# Patient Record
Sex: Male | Born: 1965 | Race: Black or African American | Hispanic: No | Marital: Single | Smoking: Never smoker
Health system: Southern US, Community
[De-identification: ages and names within clinical notes are randomized; demographics above are authoritative.]

## PROBLEM LIST (undated history)

## (undated) DIAGNOSIS — D869 Sarcoidosis, unspecified: Secondary | ICD-10-CM

## (undated) DIAGNOSIS — E119 Type 2 diabetes mellitus without complications: Secondary | ICD-10-CM

## (undated) HISTORY — PX: OTHER SURGICAL HISTORY: SHX169

---

## 2004-09-20 ENCOUNTER — Emergency Department: Payer: Self-pay | Admitting: Emergency Medicine

## 2007-03-29 ENCOUNTER — Emergency Department (HOSPITAL_COMMUNITY): Admission: EM | Admit: 2007-03-29 | Discharge: 2007-03-29 | Payer: Self-pay | Admitting: Emergency Medicine

## 2008-03-23 ENCOUNTER — Emergency Department (HOSPITAL_COMMUNITY): Admission: EM | Admit: 2008-03-23 | Discharge: 2008-03-23 | Payer: Self-pay | Admitting: Emergency Medicine

## 2008-12-21 ENCOUNTER — Ambulatory Visit: Payer: Self-pay | Admitting: Critical Care Medicine

## 2008-12-21 ENCOUNTER — Inpatient Hospital Stay (HOSPITAL_COMMUNITY): Admission: EM | Admit: 2008-12-21 | Discharge: 2008-12-24 | Payer: Self-pay | Admitting: Emergency Medicine

## 2009-01-10 DIAGNOSIS — J930 Spontaneous tension pneumothorax: Secondary | ICD-10-CM

## 2009-01-10 DIAGNOSIS — D869 Sarcoidosis, unspecified: Secondary | ICD-10-CM | POA: Insufficient documentation

## 2009-01-10 DIAGNOSIS — F1010 Alcohol abuse, uncomplicated: Secondary | ICD-10-CM | POA: Insufficient documentation

## 2009-01-10 DIAGNOSIS — J939 Pneumothorax, unspecified: Secondary | ICD-10-CM | POA: Insufficient documentation

## 2009-01-10 DIAGNOSIS — H5440 Blindness, one eye, unspecified eye: Secondary | ICD-10-CM | POA: Insufficient documentation

## 2009-01-10 DIAGNOSIS — S2249XA Multiple fractures of ribs, unspecified side, initial encounter for closed fracture: Secondary | ICD-10-CM | POA: Insufficient documentation

## 2009-01-14 ENCOUNTER — Ambulatory Visit: Payer: Self-pay | Admitting: Emergency Medicine

## 2009-01-14 LAB — CONVERTED CEMR LAB
BUN: 20 mg/dL (ref 6–23)
Potassium: 4.7 meq/L (ref 3.5–5.3)

## 2009-04-26 ENCOUNTER — Encounter: Payer: Self-pay | Admitting: Critical Care Medicine

## 2010-01-31 NOTE — Letter (Signed)
Summary: CMN for [facility]  CMN for [facility]   Imported By: Sherian Rein 05/04/2009 12:07:48  _____________________________________________________________________  External Attachment:    Type:   Image     Comment:   External Document
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
## 2010-03-14 ENCOUNTER — Emergency Department (HOSPITAL_BASED_OUTPATIENT_CLINIC_OR_DEPARTMENT_OTHER)
Admission: EM | Admit: 2010-03-14 | Discharge: 2010-03-15 | Disposition: A | Discharge status: Nursing Home (Includes ICF) | Payer: Medicaid Other | Attending: Emergency Medicine | Admitting: Emergency Medicine

## 2010-03-14 DIAGNOSIS — Z794 Long term (current) use of insulin: Secondary | ICD-10-CM | POA: Insufficient documentation

## 2010-03-14 DIAGNOSIS — E119 Type 2 diabetes mellitus without complications: Secondary | ICD-10-CM | POA: Insufficient documentation

## 2010-03-14 LAB — COMPREHENSIVE METABOLIC PANEL
Albumin: 4 g/dL (ref 3.5–5.2)
Alkaline Phosphatase: 99 U/L (ref 39–117)
BUN: 13 mg/dL (ref 6–23)
Calcium: 9 mg/dL (ref 8.4–10.5)
Creatinine, Ser: 0.9 mg/dL (ref 0.4–1.5)
Glucose, Bld: 259 mg/dL — ABNORMAL HIGH (ref 70–99)
Potassium: 4.2 mEq/L (ref 3.5–5.1)
Total Protein: 8 g/dL (ref 6.0–8.3)

## 2010-03-14 LAB — CBC
HCT: 43.8 % (ref 39.0–52.0)
Hemoglobin: 15.4 g/dL (ref 13.0–17.0)
MCH: 28.5 pg (ref 26.0–34.0)
MCHC: 35.2 g/dL (ref 30.0–36.0)
MCV: 81 fL (ref 78.0–100.0)
RDW: 12.5 % (ref 11.5–15.5)

## 2010-03-14 LAB — DIFFERENTIAL
Eosinophils Relative: 4 % (ref 0–5)
Lymphocytes Relative: 36 % (ref 12–46)
Lymphs Abs: 2.4 10*3/uL (ref 0.7–4.0)
Monocytes Absolute: 0.6 10*3/uL (ref 0.1–1.0)
Monocytes Relative: 10 % (ref 3–12)
Neutro Abs: 3.3 10*3/uL (ref 1.7–7.7)

## 2010-03-14 LAB — POCT TOXICOLOGY PANEL

## 2010-03-14 LAB — ETHANOL

## 2010-03-14 LAB — URINALYSIS, ROUTINE W REFLEX MICROSCOPIC
Glucose, UA: >1000 mg/dL — AB
Hgb urine dipstick: NEGATIVE
Leukocytes, UA: NEGATIVE
Protein, ur: NEGATIVE mg/dL
pH: 6 (ref 5.0–8.0)

## 2010-03-14 LAB — GLUCOSE, CAPILLARY: Glucose-Capillary: 197 mg/dL — ABNORMAL HIGH (ref 70–99)

## 2010-03-14 LAB — URINE MICROSCOPIC-ADD ON

## 2010-03-15 ENCOUNTER — Emergency Department (HOSPITAL_COMMUNITY)
Admission: EM | Admit: 2010-03-15 | Discharge: 2010-03-16 | Disposition: A | Discharge status: Other Acute Inpatient Hospital | Payer: Medicaid Other | Attending: Emergency Medicine | Admitting: Emergency Medicine

## 2010-03-15 DIAGNOSIS — E119 Type 2 diabetes mellitus without complications: Secondary | ICD-10-CM | POA: Insufficient documentation

## 2010-03-15 DIAGNOSIS — F29 Unspecified psychosis not due to a substance or known physiological condition: Secondary | ICD-10-CM | POA: Insufficient documentation

## 2010-03-15 DIAGNOSIS — D869 Sarcoidosis, unspecified: Secondary | ICD-10-CM | POA: Insufficient documentation

## 2010-03-15 LAB — GLUCOSE, CAPILLARY
Glucose-Capillary: 192 mg/dL — ABNORMAL HIGH (ref 70–99)
Glucose-Capillary: 162 mg/dL — ABNORMAL HIGH (ref 70–99)

## 2010-03-16 LAB — HEMOGLOBIN A1C
Hgb A1c MFr Bld: 10.2 % — ABNORMAL HIGH (ref <5.7)
Mean Plasma Glucose: 246 mg/dL — ABNORMAL HIGH (ref <117)

## 2010-03-16 LAB — GLUCOSE, CAPILLARY
Glucose-Capillary: 188 mg/dL — ABNORMAL HIGH (ref 70–99)
Glucose-Capillary: 175 mg/dL — ABNORMAL HIGH (ref 70–99)
Glucose-Capillary: 254 mg/dL — ABNORMAL HIGH (ref 70–99)
Glucose-Capillary: 243 mg/dL — ABNORMAL HIGH (ref 70–99)
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
## 2010-04-03 LAB — BASIC METABOLIC PANEL
BUN: 15 mg/dL (ref 6–23)
CO2: 26 mEq/L (ref 19–32)
Calcium: 8.7 mg/dL (ref 8.4–10.5)
Creatinine, Ser: 1.07 mg/dL (ref 0.4–1.5)
Glucose, Bld: 227 mg/dL — ABNORMAL HIGH (ref 70–99)

## 2010-04-03 LAB — GLUCOSE, CAPILLARY
Glucose-Capillary: 141 mg/dL — ABNORMAL HIGH (ref 70–99)
Glucose-Capillary: 146 mg/dL — ABNORMAL HIGH (ref 70–99)
Glucose-Capillary: 151 mg/dL — ABNORMAL HIGH (ref 70–99)
Glucose-Capillary: 180 mg/dL — ABNORMAL HIGH (ref 70–99)
Glucose-Capillary: 185 mg/dL — ABNORMAL HIGH (ref 70–99)
Glucose-Capillary: 197 mg/dL — ABNORMAL HIGH (ref 70–99)
Glucose-Capillary: 197 mg/dL — ABNORMAL HIGH (ref 70–99)
Glucose-Capillary: 217 mg/dL — ABNORMAL HIGH (ref 70–99)
Glucose-Capillary: 266 mg/dL — ABNORMAL HIGH (ref 70–99)

## 2010-04-03 LAB — POCT I-STAT, CHEM 8
BUN: 20 mg/dL (ref 6–23)
Creatinine, Ser: 1 mg/dL (ref 0.4–1.5)
Glucose, Bld: 234 mg/dL — ABNORMAL HIGH (ref 70–99)
Potassium: 4 mEq/L (ref 3.5–5.1)
Sodium: 139 mEq/L (ref 135–145)

## 2010-04-03 LAB — CBC
HCT: 45.4 % (ref 39.0–52.0)
MCHC: 33.3 g/dL (ref 30.0–36.0)
MCHC: 33.4 g/dL (ref 30.0–36.0)
MCV: 86.8 fL (ref 78.0–100.0)
Platelets: 178 10*3/uL (ref 150–400)
Platelets: 189 10*3/uL (ref 150–400)
RBC: 5.23 MIL/uL (ref 4.22–5.81)
RDW: 14.7 % (ref 11.5–15.5)

## 2010-04-03 LAB — DIFFERENTIAL
Basophils Relative: 0 % (ref 0–1)
Eosinophils Absolute: 0 10*3/uL (ref 0.0–0.7)
Eosinophils Relative: 0 % (ref 0–5)
Lymphs Abs: 1 10*3/uL (ref 0.7–4.0)
Neutrophils Relative %: 91 % — ABNORMAL HIGH (ref 43–77)

## 2010-04-03 LAB — PROTIME-INR: Prothrombin Time: 13.2 seconds (ref 11.6–15.2)

## 2010-04-03 LAB — POCT CARDIAC MARKERS
CKMB, poc: 1.1 ng/mL (ref 1.0–8.0)
Myoglobin, poc: 147 ng/mL (ref 12–200)

## 2010-04-03 LAB — HEMOGLOBIN A1C: Hgb A1c MFr Bld: 9.3 % — ABNORMAL HIGH (ref 4.6–6.1)
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
## 2011-04-10 ENCOUNTER — Emergency Department: Payer: Self-pay | Admitting: Emergency Medicine

## 2012-02-27 ENCOUNTER — Emergency Department (HOSPITAL_COMMUNITY)
Admission: EM | Admit: 2012-02-27 | Discharge: 2012-02-27 | Disposition: A | Discharge status: Home / Self Care | Payer: Medicare Other | Attending: Emergency Medicine | Admitting: Emergency Medicine

## 2012-02-27 ENCOUNTER — Encounter (HOSPITAL_COMMUNITY): Payer: Self-pay | Admitting: *Deleted

## 2012-02-27 ENCOUNTER — Emergency Department (HOSPITAL_COMMUNITY): Payer: Medicare Other

## 2012-02-27 DIAGNOSIS — R51 Headache: Secondary | ICD-10-CM | POA: Insufficient documentation

## 2012-02-27 DIAGNOSIS — J029 Acute pharyngitis, unspecified: Secondary | ICD-10-CM | POA: Insufficient documentation

## 2012-02-27 DIAGNOSIS — E119 Type 2 diabetes mellitus without complications: Secondary | ICD-10-CM | POA: Insufficient documentation

## 2012-02-27 DIAGNOSIS — R220 Localized swelling, mass and lump, head: Secondary | ICD-10-CM | POA: Insufficient documentation

## 2012-02-27 DIAGNOSIS — Z8619 Personal history of other infectious and parasitic diseases: Secondary | ICD-10-CM | POA: Insufficient documentation

## 2012-02-27 HISTORY — DX: Sarcoidosis, unspecified: D86.9

## 2012-02-27 HISTORY — DX: Type 2 diabetes mellitus without complications: E11.9

## 2012-02-27 LAB — BASIC METABOLIC PANEL
BUN: 13 mg/dL (ref 6–23)
Calcium: 9.4 mg/dL (ref 8.4–10.5)
Creatinine, Ser: 1.16 mg/dL (ref 0.50–1.35)
GFR calc Af Amer: 84 mL/min — ABNORMAL LOW (ref >90)
GFR calc non Af Amer: 72 mL/min — ABNORMAL LOW (ref >90)
Glucose, Bld: 157 mg/dL — ABNORMAL HIGH (ref 70–99)

## 2012-02-27 LAB — CBC WITH DIFFERENTIAL/PLATELET
Basophils Relative: 0 % (ref 0–1)
Eosinophils Absolute: 0.2 10*3/uL (ref 0.0–0.7)
Eosinophils Relative: 2 % (ref 0–5)
HCT: 47 % (ref 39.0–52.0)
Hemoglobin: 16.3 g/dL (ref 13.0–17.0)
MCH: 29 pg (ref 26.0–34.0)
MCHC: 34.7 g/dL (ref 30.0–36.0)
MCV: 83.6 fL (ref 78.0–100.0)
Monocytes Absolute: 0.6 10*3/uL (ref 0.1–1.0)
Monocytes Relative: 7 % (ref 3–12)
Neutrophils Relative %: 62 % (ref 43–77)

## 2012-02-27 MED ORDER — DEXAMETHASONE SODIUM PHOSPHATE 10 MG/ML IJ SOLN
10.0000 mg | Freq: Once | INTRAMUSCULAR | Status: AC
  Administered 2012-02-27: 10 mg via INTRAVENOUS
  Filled 2012-02-27: qty 1

## 2012-02-27 MED ORDER — SODIUM CHLORIDE 0.9 % IV SOLN
INTRAVENOUS | Status: DC
  Administered 2012-02-27: 12:00:00 via INTRAVENOUS

## 2012-02-27 MED ORDER — HYDROCODONE-ACETAMINOPHEN 7.5-500 MG/15ML PO SOLN: 15.0000 mL | Freq: Four times a day (QID) | ORAL | Status: AC | PRN

## 2012-02-27 MED ORDER — IOHEXOL 300 MG/ML  SOLN
75.0000 mL | Freq: Once | INTRAMUSCULAR | Status: AC | PRN
  Administered 2012-02-27: 70 mL via INTRAVENOUS

## 2012-02-27 NOTE — ED Notes (Signed)
Pt states left side facial swelling and going down neck and pt reports tongue feels swollen and hard to swallow for 2 days.  No respiratory compromise and pt is talking in complete sentences

## 2012-02-27 NOTE — ED Provider Notes (Signed)
History     CSN: [id number]  Arrival date & time 02/27/12  0930   First MD Initiated Contact with Patient 02/27/12 1111      Chief Complaint  Patient presents with  . left facial pain/swelling   . Trouble swallowing      HPI Pt was seen at 1135.   Per pt, c/o gradual onset and worsening of persistent sore throat for the past 2 days.  States the pain is located in his throat as well as to "the whole left side" of his face and neck.  Pain worsens with swallowing. Denies fevers, no cough/SOB, no drooling/stridor, no dental pain, no rash.     Past Medical History  Diagnosis Date  . Diabetes mellitus without complication   . Sarcoidosis     Past Surgical History  Procedure Laterality Date  . Lung surgery right       History  Substance Use Topics  . Smoking status: Never Smoker   . Smokeless tobacco: Not on file  . Alcohol Use: Yes     Comment: occ    Review of Systems ROS: Statement: All systems negative except as marked or noted in the HPI; Constitutional: Negative for fever and chills. ; ; Eyes: Negative for eye pain, redness and discharge. ; ; ENMT: Negative for ear pain, hoarseness, nasal congestion, sinus pressure and +sore throat. ; ; Cardiovascular: Negative for chest pain, palpitations, diaphoresis, dyspnea and peripheral edema. ; ; Respiratory: Negative for cough, wheezing and stridor. ; ; Gastrointestinal: Negative for nausea, vomiting, diarrhea, abdominal pain, blood in stool, hematemesis, jaundice and rectal bleeding. . ; ; Genitourinary: Negative for dysuria, flank pain and hematuria. ; ; Musculoskeletal: Negative for back pain and neck pain. Negative for swelling and trauma.; ; Skin: Negative for pruritus, rash, abrasions, blisters, bruising and skin lesion.; ; Neuro: Negative for headache, lightheadedness and neck stiffness. Negative for weakness, altered level of consciousness , altered mental status, extremity weakness, paresthesias, involuntary movement, seizure  and syncope.       Allergies  Review of patient's allergies indicates no known allergies.  Home Medications  No current outpatient prescriptions on file.  BP 137/100  Pulse 82  Temp(Src) 98.7 F (37.1 C) (Oral)  Resp 20  SpO2 99%  Physical Exam 1140: Physical examination:  Nursing notes reviewed; Vital signs and O2 SAT reviewed;  Constitutional: Well developed, Well nourished, Well hydrated, In no acute distress; Head:  Normocephalic, atraumatic; Eyes: EOMI, PERRL, No scleral icterus; ENMT: TM's clear bilat. +edemetous nasal turbinates bilat with clear rhinorrhea.  Mouth and pharynx without lesions. No tonsillar exudates. No intra-oral edema. No submandibular or sublingual edema. +mild posterior erythema, +hoarse voice. No drooling, no stridor. No pain with manipulation of larynx. Widespread dental decay. No gingival erythema, edema, fluctuance, or drainage. Mouth normal, Mucous membranes moist; Neck: Supple, Full range of motion, No lymphadenopathy; Cardiovascular: Regular rate and rhythm, No murmur, rub, or gallop; Respiratory: Breath sounds clear & equal bilaterally, No rales, rhonchi, wheezes.  Speaking full sentences with ease, Normal respiratory effort/excursion; Chest: Nontender, Movement normal; Abdomen: Soft, Nontender, Nondistended, Normal bowel sounds;; Extremities: Pulses normal, No tenderness, No edema, No calf edema or asymmetry.; Neuro: AA&Ox3, Major CN grossly intact.  Speech clear. No gross focal motor or sensory deficits in extremities.; Skin: Color normal, Warm, Dry.   ED Course  Procedures     MDM  MDM Reviewed: nursing note and vitals Interpretation: labs and CT scan   Results for orders placed during the hospital encounter  of 02/27/12  RAPID STREP SCREEN      Result Value Range   Streptococcus, Group A Screen (Direct) NEGATIVE  NEGATIVE  CBC WITH DIFFERENTIAL      Result Value Range   WBC 9.0  4.0 - 10.5 K/uL   RBC 5.62  4.22 - 5.81 MIL/uL   Hemoglobin  16.3  13.0 - 17.0 g/dL   HCT 16.1  09.6 - 04.5 %   MCV 83.6  78.0 - 100.0 fL   MCH 29.0  26.0 - 34.0 pg   MCHC 34.7  30.0 - 36.0 g/dL   RDW 40.9  81.1 - 91.4 %   Platelets 247  150 - 400 K/uL   Neutrophils Relative 62  43 - 77 %   Neutro Abs 5.5  1.7 - 7.7 K/uL   Lymphocytes Relative 29  12 - 46 %   Lymphs Abs 2.6  0.7 - 4.0 K/uL   Monocytes Relative 7  3 - 12 %   Monocytes Absolute 0.6  0.1 - 1.0 K/uL   Eosinophils Relative 2  0 - 5 %   Eosinophils Absolute 0.2  0.0 - 0.7 K/uL   Basophils Relative 0  0 - 1 %   Basophils Absolute 0.0  0.0 - 0.1 K/uL  BASIC METABOLIC PANEL      Result Value Range   Sodium 139  135 - 145 mEq/L   Potassium 4.1  3.5 - 5.1 mEq/L   Chloride 102  96 - 112 mEq/L   CO2 29  19 - 32 mEq/L   Glucose, Bld 157 (*) 70 - 99 mg/dL   BUN 13  6 - 23 mg/dL   Creatinine, Ser 7.82  0.50 - 1.35 mg/dL   Calcium 9.4  8.4 - 95.6 mg/dL   GFR calc non Af Amer 72 (*) >90 mL/min   GFR calc Af Amer 84 (*) >90 mL/min   Ct Soft Tissue Neck W Contrast 02/27/2012  *RADIOLOGY REPORT*  Clinical Data: 47 year old male with left face swelling radiating down the neck.  Tongue feels swollen.  Difficulty swallowing x2 days.  CT NECK WITH CONTRAST  Technique:  Multidetector CT imaging of the neck was performed with intravenous contrast.  Contrast: 70mL OMNIPAQUE IOHEXOL 300 MG/ML  SOLN  Comparison: Cervical spine CT 12/21/2008.  Findings: A skin marker placed at the area of pain is in the left submandibular region just below the level of the left submandibular gland (series 2 image 48).  The submandibular gland appears symmetric and within normal limits.  No fat stranding or thickening of the platysma.  Level I lymph nodes are within normal limits with normal fatty hila.  No cervical lymphadenopathy.  No submandibular or sublingual sialolithiasis identified.  Parapharyngeal, retropharyngeal and sublingual spaces are within normal limits.  Parotid glands are within normal limits.  Mild asymmetry  of the nasopharynx, favor benign.  Oropharynx appears more symmetric and within normal limits.  Hypopharynx, epiglottis and larynx within normal limits.  Negative thyroid.  Negative visualized superior mediastinum.  Visualized major vascular structures are patent.  Mild carotid bifurcation atherosclerosis on both sides.  Negative visualized brain parenchyma. Visualized orbit soft tissues are within normal limits.  Oral tongue within normal limits.  Scattered previous dental extractions.  No acute dental process identified.  Visible paranasal sinuses and mastoids are clear aside from mild left maxillary sinus mucosal thickening laterally. No acute osseous abnormality identified.  Chronic cervical disc and endplate degeneration at C6-V7.  Incidental right suboccipital scalp lipoma appears  simple and measures up to 38 x 16 x 59 mm, stable since 2010.  Chronic apical pulmonary scarring and septal thickening.  Chronic dependent pulmonary opacity in the right lung apex, decreased from prior.  IMPRESSION: 1.  No acute or inflammatory soft tissue findings in the neck. Marked area of concern is near the left submandibular gland, but no evidence of sialoadenitis or sialolithiasis is identified. 2.  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  3.  Chronic apical lung disease.   Original Report Authenticated By: Erskine Speed, M.D.      1525:  No drooling or stridor.  Resps continue easy, VSS.  No strep throat or abscess on CT scan.  Will tx symptomatically for now.  Wants to go home now. Dx and testing d/w pt.  Questions answered.  Verb understanding, agreeable to d/c home with outpt f/u.          Laray Anger, DO 02/29/12 1128

## 2012-06-20 ENCOUNTER — Emergency Department (HOSPITAL_COMMUNITY)
Admission: EM | Admit: 2012-06-20 | Discharge: 2012-06-20 | Discharge status: Against Medical Advice | Payer: Medicare Other | Attending: Emergency Medicine | Admitting: Emergency Medicine

## 2012-06-20 ENCOUNTER — Encounter (HOSPITAL_COMMUNITY): Payer: Self-pay | Admitting: *Deleted

## 2012-06-20 DIAGNOSIS — F10920 Alcohol use, unspecified with intoxication, uncomplicated: Secondary | ICD-10-CM

## 2012-06-20 DIAGNOSIS — F1010 Alcohol abuse, uncomplicated: Secondary | ICD-10-CM | POA: Insufficient documentation

## 2012-06-20 DIAGNOSIS — F29 Unspecified psychosis not due to a substance or known physiological condition: Secondary | ICD-10-CM | POA: Insufficient documentation

## 2012-06-20 DIAGNOSIS — E119 Type 2 diabetes mellitus without complications: Secondary | ICD-10-CM | POA: Insufficient documentation

## 2012-06-20 DIAGNOSIS — R569 Unspecified convulsions: Secondary | ICD-10-CM | POA: Insufficient documentation

## 2012-06-20 DIAGNOSIS — Z8619 Personal history of other infectious and parasitic diseases: Secondary | ICD-10-CM | POA: Insufficient documentation

## 2012-06-20 MED ORDER — ZIPRASIDONE MESYLATE 20 MG IM SOLR
INTRAMUSCULAR | Status: AC
  Filled 2012-06-20: qty 20

## 2012-06-20 MED ORDER — ZIPRASIDONE MESYLATE 20 MG IM SOLR
20.0000 mg | Freq: Once | INTRAMUSCULAR | Status: DC
  Filled 2012-06-20: qty 20

## 2012-06-20 NOTE — ED Provider Notes (Signed)
History     CSN: [id number]  Arrival date & time 06/20/12  0031   First MD Initiated Contact with Patient 06/20/12 0038      Chief Complaint  Patient presents with  . Alcohol Intoxication    (Consider location/radiation/quality/duration/timing/severity/associated sxs/prior treatment) HPI 47 year old male presents to emergency department via EMS after he called 911 for possible seizure.  Per report, patient has been drinking heavily today.  He felt that his sugar diabetes were going to bring on a seizure so he called 911.  EMS reports that he was verbally and physically abusive.  Upon their arrival.  [facility] was contacted, and patient initially agreed for transport.  Patient is currently refusing all intervention.  He reports that there is nothing wrong with him.  He does not want anything done to him.  Patient is a verbally abusive to myself and to staff.  He is threatening physical violence.  If we attempt to examine or take care of him.  He reports that he no longer feels like he is going to have a seizure.  Past Medical History  Diagnosis Date  . Diabetes mellitus without complication   . Sarcoidosis     Past Surgical History  Procedure Laterality Date  . Lung surgery right      History reviewed. No pertinent family history.  History  Substance Use Topics  . Smoking status: Never Smoker   . Smokeless tobacco: Not on file  . Alcohol Use: Yes     Comment: occ      Review of Systems  Unable to perform ROS: Psychiatric disorder    Allergies  Review of patient's allergies indicates no known allergies.  Home Medications   Current Outpatient Rx  Name  Route  Sig  Dispense  Refill  . HYDROcodone-acetaminophen (LORTAB) 7.5-500 MG/15ML solution   Oral   Take 15 mLs by mouth every 6 (six) hours as needed for pain.   120 mL   0     There were no vitals taken for this visit.  Physical Exam  Nursing note reviewed. Constitutional:  Patient refuses any examination.  He  appears awake and alert.  He ambulates without difficulty.  He appears to be competent for refusal of treatment    ED Course  Procedures (including critical care time)  Labs Reviewed - No data to display No results found.   1. Alcohol intoxication, uncomplicated       MDM  47 year old male who presents with alcohol intoxication and reported near seizure.  Patient now requesting to leave.  I advised him that it is best for him to stay and get further workup, but he refuses.  All and any intervention.  Patient is ambulatory on his own.  We'll let him sign out AMA        Olivia Mackie, MD 06/20/12 [phone]

## 2012-06-20 NOTE — ED Notes (Signed)
Patient is alert with ETOH on board.  He states that he has been drinking all day.  He added he thought he was about to have a seizure

## 2012-06-20 NOTE — ED Notes (Signed)
Pt yelling and cursing states "I don't want any treatment"; [employer] at bedside; Dr Norlene Campbell notified of pt's refusal of treatment; pt alert and oriented to person, place and time; pt denies pain; pt continues to refuse treatment, states "I wanna go"; Dr Norlene Campbell advised that if pt can ambulate without assistance pt may leave AMA; pt ambulated in hallway without assistance; pt refused to go back to room or sign AMA; pt left AMA in [employer] custody.

## 2012-07-01 ENCOUNTER — Emergency Department: Payer: Self-pay | Admitting: Emergency Medicine

## 2012-07-01 LAB — ETHANOL: Ethanol %: <0.003 % (ref 0.000–0.080)

## 2012-07-01 LAB — COMPREHENSIVE METABOLIC PANEL
Albumin: 3.5 g/dL (ref 3.4–5.0)
Anion Gap: 5 — ABNORMAL LOW (ref 7–16)
Chloride: 109 mmol/L — ABNORMAL HIGH (ref 98–107)
EGFR (African American): >60
EGFR (Non-African Amer.): >60
SGOT(AST): 17 U/L (ref 15–37)
Total Protein: 7.8 g/dL (ref 6.4–8.2)

## 2012-07-01 LAB — TROPONIN I: Troponin-I: <0.02 ng/mL

## 2012-07-01 LAB — CBC
HCT: 46 % (ref 40.0–52.0)
HGB: 15.1 g/dL (ref 13.0–18.0)
MCH: 28.3 pg (ref 26.0–34.0)
MCV: 86 fL (ref 80–100)
RBC: 5.36 10*6/uL (ref 4.40–5.90)
RDW: 13.4 % (ref 11.5–14.5)
WBC: 6.5 10*3/uL (ref 3.8–10.6)

## 2012-07-01 LAB — TSH: Thyroid Stimulating Horm: 1.12 u[IU]/mL

## 2014-03-01 DIAGNOSIS — H524 Presbyopia: Secondary | ICD-10-CM | POA: Diagnosis not present

## 2014-03-01 DIAGNOSIS — H5210 Myopia, unspecified eye: Secondary | ICD-10-CM | POA: Diagnosis not present

## 2014-10-12 ENCOUNTER — Emergency Department
Admission: EM | Admit: 2014-10-12 | Discharge: 2014-10-12 | Disposition: A | Discharge status: Home / Self Care | Payer: Commercial Managed Care - HMO | Attending: Emergency Medicine | Admitting: Emergency Medicine

## 2014-10-12 ENCOUNTER — Encounter: Payer: Self-pay | Admitting: Emergency Medicine

## 2014-10-12 DIAGNOSIS — L03111 Cellulitis of right axilla: Secondary | ICD-10-CM | POA: Diagnosis not present

## 2014-10-12 DIAGNOSIS — L729 Follicular cyst of the skin and subcutaneous tissue, unspecified: Secondary | ICD-10-CM | POA: Diagnosis present

## 2014-10-12 DIAGNOSIS — E119 Type 2 diabetes mellitus without complications: Secondary | ICD-10-CM | POA: Diagnosis not present

## 2014-10-12 MED ORDER — OXYCODONE-ACETAMINOPHEN 5-325 MG PO TABS: 1.0000 | ORAL_TABLET | Freq: Four times a day (QID) | ORAL | Status: AC | PRN

## 2014-10-12 MED ORDER — SULFAMETHOXAZOLE-TRIMETHOPRIM 800-160 MG PO TABS: 1.0000 | ORAL_TABLET | Freq: Two times a day (BID) | ORAL | Status: AC

## 2014-10-12 NOTE — ED Notes (Addendum)
Pt presents to ER with cluster of erythematous, warm, clusters of hard like cysts and/or abscess with tenderness on palpation located under right axillary for the past 5 days. 10/10 pain

## 2014-10-12 NOTE — Discharge Instructions (Signed)

## 2014-10-12 NOTE — ED Provider Notes (Signed)
[facility] Emergency Department Provider Note  ____________________________________________  Time seen: Approximately 12:09 PM  I have reviewed the triage vital signs and the nursing notes.   HISTORY  Chief Complaint Cyst    HPI Joseph Wall is a 49 y.o. male who presents to the emergency department complaining of multiple "knots" underneath his right arm. He states that they have been developing over the last 5 days. He states that they are red, increasing in size, and tender to touch. He denies any history of same in the past. Is taking any medication prior to arrival. He denies any drainage from the area. He states that the pain is constant, and severe especially with movement. He describes it as a pressure/burning sensation no fever/chills, no nausea or vomiting, no other skin lesions.. No other complaints.   Past Medical History  Diagnosis Date  . Diabetes mellitus without complication (HCC)   . Sarcoidosis [facility])     Patient Active Problem List   Diagnosis Date Noted  . SARCOIDOSIS 01/10/2009  . ALCOHOL USE 01/10/2009  . BLINDNESS, RIGHT EYE 01/10/2009  . Pneumothorax 01/10/2009  . FRACTURE, RIBS, MULTIPLE 01/10/2009  . PNEUMOTHORAX 01/10/2009    Past Surgical History  Procedure Laterality Date  . Lung surgery right      Current Outpatient Rx  Name  Route  Sig  Dispense  Refill  . HYDROcodone-acetaminophen (LORTAB) 7.5-500 MG/15ML solution   Oral   Take 15 mLs by mouth every 6 (six) hours as needed for pain.   120 mL   0   . oxyCODONE-acetaminophen (ROXICET) 5-325 MG tablet   Oral   Take 1 tablet by mouth every 6 (six) hours as needed for severe pain.   20 tablet   0   . sulfamethoxazole-trimethoprim (BACTRIM DS,SEPTRA DS) 800-160 MG tablet   Oral   Take 1 tablet by mouth 2 (two) times daily.   14 tablet   0     Allergies Review of patient's allergies indicates no known allergies.  History reviewed. No pertinent family  history.  Social History Social History  Substance Use Topics  . Smoking status: Never Smoker   . Smokeless tobacco: None  . Alcohol Use: Yes     Comment: occ    Review of Systems Constitutional: No fever/chills Eyes: No visual changes. ENT: No sore throat. Cardiovascular: Denies chest pain. Respiratory: Denies shortness of breath. Gastrointestinal: No abdominal pain.  No nausea, no vomiting.  No diarrhea.  No constipation. Genitourinary: Negative for dysuria. Musculoskeletal: Negative for back pain. Skin: Negative for rash. Endorses "knots" in his right axilla. Neurological: Negative for headaches, focal weakness or numbness.  10-point ROS otherwise negative.  ____________________________________________   PHYSICAL EXAM:  VITAL SIGNS: ED Triage Vitals  Enc Vitals Group     BP 10/12/14 1157 107/75 mmHg     Pulse Rate 10/12/14 1157 73     Resp 10/12/14 1157 18     Temp 10/12/14 1157 97.8 F (36.6 C)     Temp Source 10/12/14 1157 Oral     SpO2 10/12/14 1157 99 %     Weight 10/12/14 1157 190 lb (86.183 kg)     Height 10/12/14 1157  (1.88 m)     Head Cir --      Peak Flow --      Pain Score 10/12/14 1158 10     Pain Loc --      Pain Edu? --      Excl. in GC? --  Constitutional: Alert and oriented. Well appearing and in no acute distress. Eyes: Conjunctivae are normal. PERRL. EOMI. Head: Atraumatic. Nose: No congestion/rhinnorhea. Mouth/Throat: Mucous membranes are moist.  Oropharynx non-erythematous. Neck: No stridor.   Hematological/Lymphatic/Immunilogical: No cervical lymphadenopathy Cardiovascular: Normal rate, regular rhythm. Grossly normal heart sounds.  Good peripheral circulation. Respiratory: Normal respiratory effort.  No retractions. Lungs CTAB. Gastrointestinal: Soft and nontender. No distention. No abdominal bruits. No CVA tenderness. Musculoskeletal: No lower extremity tenderness nor edema.  No joint effusions. Neurologic:  Normal speech  and language. No gross focal neurologic deficits are appreciated. No gait instability. Skin:  Skin is warm, dry and intact. No rash noted. Erythematous, edematous lesions noted in the right axilla. No drainage noted. Area is diffusely tender to palpation. Area is firm and raised with no fluctuance noted. Psychiatric: Mood and affect are normal. Speech and behavior are normal.  ____________________________________________   LABS (all labs ordered are listed, but only abnormal results are displayed)  Labs Reviewed - No data to display ____________________________________________  EKG   ____________________________________________  RADIOLOGY   ____________________________________________   PROCEDURES  Procedure(s) performed: None  Critical Care performed: No  ____________________________________________   INITIAL IMPRESSION / ASSESSMENT AND PLAN / ED COURSE  Pertinent labs & imaging results that were available during my care of the patient were reviewed by me and considered in my medical decision making (see chart for details).  The patient's history, symptoms, and exam are consistent with cellulitis to the right axillary region. The areas are firm to palpation with no fluctuance. Advised the patient at this time there was no signs of abscess. I'm placing the patient on pain medication for symptom relief and providing antibiotics. It is a diabetic. I advised the patient to keep a close eye on this area. The patient is to return for any increase of symptoms including fever/chills, nausea/vomiting, spreading erythema or edema. The patient verbalizes understanding of the diagnosis and verbalizes compliance with the treatment plan. ____________________________________________   FINAL CLINICAL IMPRESSION(S) / ED DIAGNOSES  Final diagnoses:  Cellulitis of axilla, right      Racheal Patches, PA-C 10/12/14 1224  Sharman Cheek, MD 10/13/14 [phone]

## 2015-03-31 ENCOUNTER — Emergency Department
Admission: EM | Admit: 2015-03-31 | Discharge: 2015-03-31 | Disposition: A | Discharge status: Home / Self Care | Payer: Commercial Managed Care - HMO | Attending: Emergency Medicine | Admitting: Emergency Medicine

## 2015-03-31 ENCOUNTER — Emergency Department: Payer: Commercial Managed Care - HMO

## 2015-03-31 DIAGNOSIS — S99911A Unspecified injury of right ankle, initial encounter: Secondary | ICD-10-CM | POA: Diagnosis present

## 2015-03-31 DIAGNOSIS — E119 Type 2 diabetes mellitus without complications: Secondary | ICD-10-CM | POA: Diagnosis not present

## 2015-03-31 DIAGNOSIS — Z792 Long term (current) use of antibiotics: Secondary | ICD-10-CM | POA: Diagnosis not present

## 2015-03-31 DIAGNOSIS — M25571 Pain in right ankle and joints of right foot: Secondary | ICD-10-CM | POA: Diagnosis not present

## 2015-03-31 DIAGNOSIS — Y998 Other external cause status: Secondary | ICD-10-CM | POA: Insufficient documentation

## 2015-03-31 DIAGNOSIS — Y9289 Other specified places as the place of occurrence of the external cause: Secondary | ICD-10-CM | POA: Diagnosis not present

## 2015-03-31 DIAGNOSIS — S9001XA Contusion of right ankle, initial encounter: Secondary | ICD-10-CM

## 2015-03-31 DIAGNOSIS — W228XXA Striking against or struck by other objects, initial encounter: Secondary | ICD-10-CM | POA: Diagnosis not present

## 2015-03-31 DIAGNOSIS — Y9389 Activity, other specified: Secondary | ICD-10-CM | POA: Diagnosis not present

## 2015-03-31 MED ORDER — NAPROXEN 500 MG PO TABS
500.0000 mg | ORAL_TABLET | Freq: Once | ORAL | Status: AC
  Administered 2015-03-31: 500 mg via ORAL
  Filled 2015-03-31: qty 1

## 2015-03-31 MED ORDER — NAPROXEN 500 MG PO TABS: 500.0000 mg | ORAL_TABLET | Freq: Two times a day (BID) | ORAL | Status: DC

## 2015-03-31 MED ORDER — TRAMADOL HCL 50 MG PO TABS: 50.0000 mg | ORAL_TABLET | Freq: Four times a day (QID) | ORAL | Status: DC | PRN

## 2015-03-31 MED ORDER — OXYCODONE-ACETAMINOPHEN 5-325 MG PO TABS
1.0000 | ORAL_TABLET | Freq: Once | ORAL | Status: AC
  Administered 2015-03-31: 1 via ORAL
  Filled 2015-03-31: qty 1

## 2015-03-31 NOTE — ED Notes (Signed)
See triage note  States he developed pain to right ankle after stepping on board  States board hit ankle   Increased pain with walking

## 2015-03-31 NOTE — Discharge Instructions (Signed)
Ambulate with crutches for 2-3 days as needed.

## 2015-03-31 NOTE — ED Provider Notes (Signed)
[facility] Emergency Department Provider Note  ____________________________________________  Time seen: Approximately 7:23 AM  I have reviewed the triage vital signs and the nursing notes.   HISTORY  Chief Complaint Ankle Pain    HPI Joseph Wall is a 50 y.o. male patient complaining the right medialankle pain secondary to contusion which occurred at 2100 hrs. last night. Patient said he went down some steps and the wooden step collapsed striking the medial aspect of his ankle. Patient stated this been continued pain since incident. Patient also states decreased sensation to the third through fifth toes. Patient state increased pain with ambulations. Patient rates the pain as 10 over 10. Patient describes the pain as "sharp". Positive measures taken for complaint. Past Medical History  Diagnosis Date  . Diabetes mellitus without complication (HCC)   . Sarcoidosis [facility])     Patient Active Problem List   Diagnosis Date Noted  . SARCOIDOSIS 01/10/2009  . ALCOHOL USE 01/10/2009  . BLINDNESS, RIGHT EYE 01/10/2009  . Pneumothorax 01/10/2009  . FRACTURE, RIBS, MULTIPLE 01/10/2009  . PNEUMOTHORAX 01/10/2009    Past Surgical History  Procedure Laterality Date  . Lung surgery right      Current Outpatient Rx  Name  Route  Sig  Dispense  Refill  . HYDROcodone-acetaminophen (LORTAB) 7.5-500 MG/15ML solution   Oral   Take 15 mLs by mouth every 6 (six) hours as needed for pain.   120 mL   0   . naproxen (NAPROSYN) 500 MG tablet   Oral   Take 1 tablet (500 mg total) by mouth 2 (two) times daily with a meal.   20 tablet   0   . oxyCODONE-acetaminophen (ROXICET) 5-325 MG tablet   Oral   Take 1 tablet by mouth every 6 (six) hours as needed for severe pain.   20 tablet   0   . sulfamethoxazole-trimethoprim (BACTRIM DS,SEPTRA DS) 800-160 MG tablet   Oral   Take 1 tablet by mouth 2 (two) times daily.   14 tablet   0   . traMADol (ULTRAM) 50 MG  tablet   Oral   Take 1 tablet (50 mg total) by mouth every 6 (six) hours as needed for moderate pain.   12 tablet   0     Allergies Review of patient's allergies indicates no known allergies.  No family history on file.  Social History Social History  Substance Use Topics  . Smoking status: Never Smoker   . Smokeless tobacco: Not on file  . Alcohol Use: Yes     Comment: occ    Review of Systems Constitutional: No fever/chills Eyes: No visual changes. ENT: No sore throat. Cardiovascular: Denies chest pain. Respiratory: Denies shortness of breath. Gastrointestinal: No abdominal pain.  No nausea, no vomiting.  No diarrhea.  No constipation. Genitourinary: Negative for dysuria. Musculoskeletal: Right medial ankle pain Skin: Negative for rash. Neurological: Negative for headaches, focal weakness or numbness. Endocrine:Diabetes   ____________________________________________   PHYSICAL EXAM:  VITAL SIGNS: ED Triage Vitals  Enc Vitals Group     BP 03/31/15 0630 130/87 mmHg     Pulse Rate 03/31/15 0630 89     Resp 03/31/15 0630 20     Temp 03/31/15 0630 98.2 F (36.8 C)     Temp Source 03/31/15 0630 Oral     SpO2 03/31/15 0630 97 %     Weight 03/31/15 0630 190 lb (86.183 kg)     Height 03/31/15 0630  (1.88 m)  Head Cir --      Peak Flow --      Pain Score 03/31/15 0630 10     Pain Loc --      Pain Edu? --      Excl. in GC? --     Constitutional: Alert and oriented. Well appearing and in no acute distress. Eyes: Conjunctivae are normal. PERRL. EOMI. Head: Atraumatic. Nose: No congestion/rhinnorhea. Mouth/Throat: Mucous membranes are moist.  Oropharynx non-erythematous. Neck: No stridor.  No cervical spine tenderness to palpation. Hematological/Lymphatic/Immunilogical: No cervical lymphadenopathy. Cardiovascular: Normal rate, regular rhythm. Grossly normal heart sounds.  Good peripheral circulation. Respiratory: Normal respiratory effort.  No  retractions. Lungs CTAB. Gastrointestinal: Soft and nontender. No distention. No abdominal bruits. No CVA tenderness. Musculoskeletal: No lower extremity tenderness nor edema.  No joint effusions. Neurologic:  Normal speech and language. No gross focal neurologic deficits are appreciated. No gait instability. Skin:  Skin is warm, dry and intact. No rash noted. Psychiatric: Mood and affect are normal. Speech and behavior are normal.  ____________________________________________   LABS (all labs ordered are listed, but only abnormal results are displayed)  Labs Reviewed - No data to display ____________________________________________  EKG   ____________________________________________  RADIOLOGY no acute findings on x-ray. I, Joni Reiningonald K Halsey Hammen, personally viewed and evaluated these images (plain radiographs) as part of my medical decision making, as well as reviewing the written report by the radiologist.  ____________________________________________   PROCEDURES  Procedure(s) performed: None  Critical Care performed: No  ____________________________________________   INITIAL IMPRESSION / ASSESSMENT AND PLAN / ED COURSE  Pertinent labs & imaging results that were available during my care of the patient were reviewed by me and considered in my medical decision making (see chart for details).  Contusion right ankle. Discussed x-ray finding with patient. Patient given prescription for naproxen and tramadol. Patient given crutches for ambulation as needed. Patient advised [facility] condition persists. ____________________________________________   FINAL CLINICAL IMPRESSION(S) / ED DIAGNOSES  Final diagnoses:  Contusion of right ankle, initial encounter      Joni ReiningRonald K Johnny Latu, PA-C 03/31/15 [id number]  Jene Everyobert Kinner, MD 03/31/15 [phone]

## 2015-03-31 NOTE — ED Notes (Signed)
Pt to triage via w/c with no distress noted; pt reports stepped on board and came up and hit him in right ankle; c/o pain since

## 2015-04-14 DIAGNOSIS — Z114 Encounter for screening for human immunodeficiency virus [HIV]: Secondary | ICD-10-CM | POA: Diagnosis not present

## 2015-04-14 DIAGNOSIS — M79671 Pain in right foot: Secondary | ICD-10-CM | POA: Diagnosis not present

## 2015-04-14 DIAGNOSIS — Z1329 Encounter for screening for other suspected endocrine disorder: Secondary | ICD-10-CM | POA: Diagnosis not present

## 2015-04-14 DIAGNOSIS — E1142 Type 2 diabetes mellitus with diabetic polyneuropathy: Secondary | ICD-10-CM | POA: Diagnosis not present

## 2015-04-14 DIAGNOSIS — R5383 Other fatigue: Secondary | ICD-10-CM | POA: Diagnosis not present

## 2015-04-14 DIAGNOSIS — D869 Sarcoidosis, unspecified: Secondary | ICD-10-CM | POA: Diagnosis not present

## 2015-04-14 DIAGNOSIS — H5441 Blindness, right eye, normal vision left eye: Secondary | ICD-10-CM | POA: Diagnosis not present

## 2015-04-14 DIAGNOSIS — Z1389 Encounter for screening for other disorder: Secondary | ICD-10-CM | POA: Diagnosis not present

## 2015-04-28 DIAGNOSIS — D869 Sarcoidosis, unspecified: Secondary | ICD-10-CM | POA: Diagnosis not present

## 2015-04-28 DIAGNOSIS — Z1329 Encounter for screening for other suspected endocrine disorder: Secondary | ICD-10-CM | POA: Diagnosis not present

## 2015-04-28 DIAGNOSIS — E1142 Type 2 diabetes mellitus with diabetic polyneuropathy: Secondary | ICD-10-CM | POA: Diagnosis not present

## 2015-04-28 DIAGNOSIS — K4090 Unilateral inguinal hernia, without obstruction or gangrene, not specified as recurrent: Secondary | ICD-10-CM | POA: Diagnosis not present

## 2015-04-28 DIAGNOSIS — N179 Acute kidney failure, unspecified: Secondary | ICD-10-CM | POA: Diagnosis not present

## 2015-04-28 DIAGNOSIS — R079 Chest pain, unspecified: Secondary | ICD-10-CM | POA: Diagnosis not present

## 2015-04-28 DIAGNOSIS — M62838 Other muscle spasm: Secondary | ICD-10-CM | POA: Diagnosis not present

## 2015-11-30 DIAGNOSIS — D869 Sarcoidosis, unspecified: Secondary | ICD-10-CM | POA: Diagnosis not present

## 2015-11-30 DIAGNOSIS — E1142 Type 2 diabetes mellitus with diabetic polyneuropathy: Secondary | ICD-10-CM | POA: Diagnosis not present

## 2015-11-30 DIAGNOSIS — Z1329 Encounter for screening for other suspected endocrine disorder: Secondary | ICD-10-CM | POA: Diagnosis not present

## 2015-11-30 DIAGNOSIS — E1165 Type 2 diabetes mellitus with hyperglycemia: Secondary | ICD-10-CM | POA: Diagnosis not present

## 2015-12-01 ENCOUNTER — Encounter: Payer: Self-pay | Admitting: Pulmonary Disease

## 2015-12-08 ENCOUNTER — Ambulatory Visit (INDEPENDENT_AMBULATORY_CARE_PROVIDER_SITE_OTHER): Payer: Commercial Managed Care - HMO | Admitting: Pulmonary Disease

## 2015-12-08 ENCOUNTER — Encounter: Payer: Self-pay | Admitting: Pulmonary Disease

## 2015-12-08 VITALS — BP 118/70 | HR 78 | Ht 74.0 in | Wt 191.6 lb

## 2015-12-08 DIAGNOSIS — D869 Sarcoidosis, unspecified: Secondary | ICD-10-CM | POA: Diagnosis not present

## 2015-12-08 DIAGNOSIS — Z794 Long term (current) use of insulin: Secondary | ICD-10-CM

## 2015-12-08 DIAGNOSIS — E118 Type 2 diabetes mellitus with unspecified complications: Secondary | ICD-10-CM

## 2015-12-08 DIAGNOSIS — R0609 Other forms of dyspnea: Secondary | ICD-10-CM

## 2015-12-08 MED ORDER — FLUTICASONE FUROATE-VILANTEROL 200-25 MCG/INH IN AEPB: 1.0000 | INHALATION_SPRAY | Freq: Every day | RESPIRATORY_TRACT | 0 refills | Status: AC

## 2015-12-08 MED ORDER — FLUTICASONE FUROATE-VILANTEROL 200-25 MCG/INH IN AEPB: 1.0000 | INHALATION_SPRAY | Freq: Every day | RESPIRATORY_TRACT | 10 refills | Status: AC

## 2015-12-08 NOTE — Patient Instructions (Signed)
1) We have provided a sample of Breo inhaler (which is very similar to Advair that you used before). Use one inhalation once a day  2) Return in 2-4 weeks with chest Xray and Pulmonary (lung) function tests prior to that visit

## 2015-12-11 NOTE — Progress Notes (Signed)
PULMONARY CONSULT NOTE  Requesting MD/Service: [facility] Date of initial consultation: 12/08/15 Reason for consultation: Sarcoidosis  PT PROFILE: 50 y.o. 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 Has not been followed by any pulmonologist for many years. Referred to establish care  HPI:  [age] M as above. He notes gradually increasing DOE, SOB over past couple of years. He also notes chest tightness and occasional night sweats over past few months. He has been treated with Prednisone in the past but indicates that this exacerbated his DM2 significantly. He has also been treated with albuterol MDI which he doesn't think did much good and Advair which he believes was beneficial. He has never smoked and has no significant environmental exposures.  He denies CP, fever, purulent sputum, hemoptysis, LE edema and calf tenderness. He has a prior history of incarceration and there is documentation of at least one ED encounter for intoxication   Past Medical History:  Diagnosis Date  . Diabetes mellitus without complication (HCC)   . Sarcoidosis [facility](HCC)     Past Surgical History:  Procedure Laterality Date  . lung surgery right      MEDICATIONS: I have reviewed all medications and confirmed regimen as documented  Social History   Social History  . Marital status: Single    Spouse name: N/A  . Number of children: N/A  . Years of education: N/A   Occupational History  . Not on file.   Social History Main Topics  . Smoking status: Never Smoker  . Smokeless tobacco: Never Used  . Alcohol use Yes     Comment: occ  . Drug use: No  . Sexual activity: Not on file   Other Topics Concern  . Not on file   Social History Narrative  . No narrative on file    Family History  Problem Relation Age of Onset  . Diabetes Mother   . Hypertension Father   . Diabetes Brother   . Diabetes Maternal Grandmother   . Hypertension  Paternal Grandmother     ROS: No fever, myalgias/arthralgias, unexplained weight loss or weight gain No new focal weakness or sensory deficits No otalgia, hearing loss, visual changes, nasal and sinus symptoms, mouth and throat problems No neck pain or adenopathy No abdominal pain, N/V/D, diarrhea, change in bowel pattern No dysuria, change in urinary pattern   Vitals:   12/08/15 1335  BP: 118/70  Pulse: 78  SpO2: 98%  Weight: 191 lb 9.6 oz (86.9 kg)  Height: 6\' 2"  (1.88 m)     EXAM:  Gen: WDWN, seems somewhat breathless with speech HEENT: NCAT, sclera white, oropharynx normal Neck: Supple without LAN, thyromegaly, JVD Lungs: breath sounds: full, percussion: normal, No adventitious sounds Cardiovascular: RRR, no murmurs noted Abdomen: Soft, nontender, normal BS Ext: without clubbing, cyanosis, edema Neuro: CNs grossly intact, motor and sensory intact Skin: Limited exam, no lesions noted  DATA:   BMP Latest Ref Rng & Units 07/01/2012 02/27/2012 03/14/2010  Glucose 65 - 99 mg/dL 409(W146(H) 119(J157(H) 478(G259(H)  BUN 7 - 18 mg/dL 12 13 13   Creatinine 0.60 - 1.30 mg/dL 9.561.19 2.131.16 0.9  Sodium 086136 - 145 mmol/L 142 139 140  Potassium 3.5 - 5.1 mmol/L 4.3 4.1 4.2  Chloride 98 - 107 mmol/L 109(H) 102 107  CO2 21 - 32 mmol/L 28 29 24   Calcium 8.5 - 10.1 mg/dL 9.0 9.4 9.0    CBC Latest Ref Rng & Units 07/01/2012 02/27/2012 03/14/2010  WBC 3.8 - 10.6 x10 3/mm 3 6.5 9.0 6.6  Hemoglobin 13.0 - 18.0 g/dL 21.315.1 08.616.3 57.815.4  Hematocrit 40.0 - 52.0 % 46.0 47.0 43.8  Platelets 150 - 440 x10 3/mm 3 181 247 180    CXR:  No recent films available for review  IMPRESSION:     ICD-9-CM ICD-10-CM   1. Sarcoidosis (HCC) 135 D86.9 DG Chest 2 View     Pulmonary function test  2. DOE (dyspnea on exertion) 786.09 R06.09   3. Type 2 diabetes mellitus with complication, with long-term current use of insulin (HCC) 250.90 E11.8    V58.67 Z79.4    Based on severity of reported impairment and the appearance of  breathlessness with speech, it is likely that his pulmonary sarcoidosis is quite severe and seemingly progressive. He is on disability which he indicates is due to sarcoidosis and DM. He has not tolerated systemic steroids due to exacerbation of DM. Advair was symptomatically beneficial for him previously. He is probably not a candidate for lung transplantation for psychosocial reasons  PLAN:  1) Breo inhaler - sample provided and Rx entered 2) ROV 2-4 weeks with full PFTs and CXR   Billy Fischeravid Simonds, MD PCCM service Mobile [phone] Pager [phone] 12/11/2015

## 2015-12-15 ENCOUNTER — Other Ambulatory Visit: Payer: Self-pay

## 2015-12-15 DIAGNOSIS — D869 Sarcoidosis, unspecified: Secondary | ICD-10-CM

## 2015-12-20 ENCOUNTER — Other Ambulatory Visit: Payer: Self-pay | Admitting: Pulmonary Disease

## 2015-12-20 DIAGNOSIS — R0602 Shortness of breath: Secondary | ICD-10-CM

## 2015-12-22 ENCOUNTER — Ambulatory Visit: Payer: Commercial Managed Care - HMO | Attending: Pulmonary Disease

## 2015-12-22 DIAGNOSIS — D869 Sarcoidosis, unspecified: Secondary | ICD-10-CM | POA: Insufficient documentation

## 2015-12-22 DIAGNOSIS — R0602 Shortness of breath: Secondary | ICD-10-CM | POA: Diagnosis not present

## 2015-12-22 MED ORDER — ALBUTEROL SULFATE (2.5 MG/3ML) 0.083% IN NEBU
2.5000 mg | INHALATION_SOLUTION | Freq: Once | RESPIRATORY_TRACT | Status: AC
  Administered 2015-12-22: 2.5 mg via RESPIRATORY_TRACT
  Filled 2015-12-22: qty 3

## 2016-01-03 ENCOUNTER — Encounter: Payer: Self-pay | Admitting: *Deleted

## 2016-01-03 ENCOUNTER — Ambulatory Visit: Payer: Self-pay | Admitting: Pulmonary Disease

## 2016-01-04 DIAGNOSIS — E1165 Type 2 diabetes mellitus with hyperglycemia: Secondary | ICD-10-CM | POA: Diagnosis not present

## 2016-01-31 NOTE — Progress Notes (Deleted)
PULMONARY CONSULT NOTE  Requesting MD/Service: [facility] Date of initial consultation: 12/08/15 Reason for consultation: Sarcoidosis  PT PROFILE: 51 y.o. 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 Has not been followed by any pulmonologist for many years. Referred to establish care  HPI:  [age] M as above. He notes gradually increasing DOE, SOB over past couple of years.  He has a prior history of incarceration and there is documentation of at least one ED encounter for intoxication. He saw Dr. Sung AmabileSimonds in December of 2017 to establish care for history of sarcoidosis. He is on disability which he indicates is due to sarcoidosis and DM. He has not tolerated systemic steroids due to exacerbation of DM. Advair was symptomatically beneficial for him previously. He was sent for a baseline CXR and PFT and started empirically on Breo inhaler.   MEDICATIONS: I have reviewed all medications and confirmed regimen as documented  ROS: No fever, myalgias/arthralgias, unexplained weight loss or weight gain No new focal weakness or sensory deficits No otalgia, hearing loss, visual changes, nasal and sinus symptoms, mouth and throat problems No neck pain or adenopathy No abdominal pain, N/V/D, diarrhea, change in bowel pattern No dysuria, change in urinary pattern   There were no vitals filed for this visit.   EXAM:  Gen: WDWN, seems somewhat breathless with speech HEENT: NCAT, sclera white, oropharynx normal Neck: Supple without LAN, thyromegaly, JVD Lungs: breath sounds: full, percussion: normal, No adventitious sounds Cardiovascular: RRR, no murmurs noted Abdomen: Soft, nontender, normal BS Ext: without clubbing, cyanosis, edema Neuro: CNs grossly intact, motor and sensory intact Skin: Limited exam, no lesions noted  DATA:   BMP Latest Ref Rng & Units 07/01/2012 02/27/2012 03/14/2010  Glucose 65 - 99 mg/dL 409(W146(H) 119(J157(H) 478(G259(H)   BUN 7 - 18 mg/dL 12 13 13   Creatinine 0.60 - 1.30 mg/dL 9.561.19 2.131.16 0.9  Sodium 086136 - 145 mmol/L 142 139 140  Potassium 3.5 - 5.1 mmol/L 4.3 4.1 4.2  Chloride 98 - 107 mmol/L 109(H) 102 107  CO2 21 - 32 mmol/L 28 29 24   Calcium 8.5 - 10.1 mg/dL 9.0 9.4 9.0    CBC Latest Ref Rng & Units 07/01/2012 02/27/2012 03/14/2010  WBC 3.8 - 10.6 x10 3/mm 3 6.5 9.0 6.6  Hemoglobin 13.0 - 18.0 g/dL 57.815.1 46.916.3 62.915.4  Hematocrit 40.0 - 52.0 % 46.0 47.0 43.8  Platelets 150 - 440 x10 3/mm 3 181 247 180    CXR:  No recent films available for review  IMPRESSION:   No diagnosis found. Based on severity of reported impairment and the appearance of breathlessness with speech, it is likely that his pulmonary sarcoidosis is quite severe and seemingly progressive. He is on disability which he indicates is due to sarcoidosis and DM. He has not tolerated systemic steroids due to exacerbation of DM. Advair was symptomatically beneficial for him previously. He is probably not a candidate for lung transplantation for psychosocial reasons  PLAN:  --Opthalmology referral.  --Baseline ECG.  --Check ACE, CMP, Calcium levels.  --TB quantiferon, as patient may need to be started on alternative immunosuppressive agents.     Wells Guileseep Chenita Ruda, MD PCCM service Pager [phone] 01/31/2016

## 2016-02-14 ENCOUNTER — Ambulatory Visit: Payer: Self-pay | Admitting: Internal Medicine

## 2016-02-14 ENCOUNTER — Ambulatory Visit: Payer: Self-pay | Admitting: Pulmonary Disease

## 2016-02-14 ENCOUNTER — Encounter: Payer: Self-pay | Admitting: *Deleted

## 2016-03-21 DIAGNOSIS — H524 Presbyopia: Secondary | ICD-10-CM | POA: Diagnosis not present

## 2016-03-21 DIAGNOSIS — E113393 Type 2 diabetes mellitus with moderate nonproliferative diabetic retinopathy without macular edema, bilateral: Secondary | ICD-10-CM | POA: Diagnosis not present

## 2016-05-01 ENCOUNTER — Other Ambulatory Visit: Payer: Self-pay | Admitting: Pharmacy Technician

## 2016-05-01 NOTE — Patient Outreach (Signed)
[facility]) Care Management  05/01/2016  Joseph Wall 1965-11-09 [id number]   Spoke to patient about Atorvastatin medication adherence. Patient verified he has been taking medication and has an abundance of meds.   [name] [facility] Care Management [phone]

## 2016-07-31 DIAGNOSIS — H40153 Residual stage of open-angle glaucoma, bilateral: Secondary | ICD-10-CM | POA: Diagnosis not present

## 2016-08-01 DIAGNOSIS — H348112 Central retinal vein occlusion, right eye, stable: Secondary | ICD-10-CM | POA: Diagnosis not present

## 2016-08-09 DIAGNOSIS — H4051X3 Glaucoma secondary to other eye disorders, right eye, severe stage: Secondary | ICD-10-CM | POA: Diagnosis not present

## 2016-08-28 DIAGNOSIS — H40153 Residual stage of open-angle glaucoma, bilateral: Secondary | ICD-10-CM | POA: Diagnosis not present

## 2017-01-03 ENCOUNTER — Other Ambulatory Visit: Payer: Self-pay | Admitting: Pulmonary Disease

## 2017-02-20 DIAGNOSIS — E119 Type 2 diabetes mellitus without complications: Secondary | ICD-10-CM | POA: Diagnosis not present

## 2017-02-20 DIAGNOSIS — H1032 Unspecified acute conjunctivitis, left eye: Secondary | ICD-10-CM | POA: Diagnosis not present

## 2017-02-20 DIAGNOSIS — B9789 Other viral agents as the cause of diseases classified elsewhere: Secondary | ICD-10-CM | POA: Diagnosis not present

## 2017-02-20 DIAGNOSIS — Z8673 Personal history of transient ischemic attack (TIA), and cerebral infarction without residual deficits: Secondary | ICD-10-CM | POA: Diagnosis not present

## 2017-02-20 DIAGNOSIS — H5461 Unqualified visual loss, right eye, normal vision left eye: Secondary | ICD-10-CM | POA: Diagnosis not present

## 2017-02-20 DIAGNOSIS — H538 Other visual disturbances: Secondary | ICD-10-CM | POA: Diagnosis not present

## 2017-02-20 DIAGNOSIS — Z7984 Long term (current) use of oral hypoglycemic drugs: Secondary | ICD-10-CM | POA: Diagnosis not present

## 2017-02-20 DIAGNOSIS — H5712 Ocular pain, left eye: Secondary | ICD-10-CM | POA: Diagnosis not present

## 2017-02-28 DIAGNOSIS — J069 Acute upper respiratory infection, unspecified: Secondary | ICD-10-CM | POA: Diagnosis not present

## 2017-02-28 DIAGNOSIS — E1165 Type 2 diabetes mellitus with hyperglycemia: Secondary | ICD-10-CM | POA: Diagnosis not present

## 2017-02-28 DIAGNOSIS — Z792 Long term (current) use of antibiotics: Secondary | ICD-10-CM | POA: Diagnosis not present

## 2017-02-28 DIAGNOSIS — R0982 Postnasal drip: Secondary | ICD-10-CM | POA: Diagnosis not present

## 2017-02-28 DIAGNOSIS — Z7984 Long term (current) use of oral hypoglycemic drugs: Secondary | ICD-10-CM | POA: Diagnosis not present

## 2017-02-28 DIAGNOSIS — Z79899 Other long term (current) drug therapy: Secondary | ICD-10-CM | POA: Diagnosis not present

## 2017-02-28 DIAGNOSIS — Z7251 High risk heterosexual behavior: Secondary | ICD-10-CM | POA: Diagnosis not present

## 2017-02-28 DIAGNOSIS — Z8673 Personal history of transient ischemic attack (TIA), and cerebral infarction without residual deficits: Secondary | ICD-10-CM | POA: Diagnosis not present

## 2017-02-28 DIAGNOSIS — D869 Sarcoidosis, unspecified: Secondary | ICD-10-CM | POA: Diagnosis not present

## 2017-03-02 DIAGNOSIS — H524 Presbyopia: Secondary | ICD-10-CM | POA: Diagnosis not present

## 2017-03-02 DIAGNOSIS — Z0100 Encounter for examination of eyes and vision without abnormal findings: Secondary | ICD-10-CM | POA: Diagnosis not present

## 2017-03-12 DIAGNOSIS — H04123 Dry eye syndrome of bilateral lacrimal glands: Secondary | ICD-10-CM | POA: Diagnosis not present

## 2017-03-12 DIAGNOSIS — H4089 Other specified glaucoma: Secondary | ICD-10-CM | POA: Diagnosis not present

## 2017-03-12 DIAGNOSIS — H01002 Unspecified blepharitis right lower eyelid: Secondary | ICD-10-CM | POA: Diagnosis not present

## 2017-03-12 DIAGNOSIS — H01001 Unspecified blepharitis right upper eyelid: Secondary | ICD-10-CM | POA: Diagnosis not present

## 2017-03-19 DIAGNOSIS — H4089 Other specified glaucoma: Secondary | ICD-10-CM | POA: Diagnosis not present

## 2017-03-19 DIAGNOSIS — H01002 Unspecified blepharitis right lower eyelid: Secondary | ICD-10-CM | POA: Diagnosis not present

## 2017-03-19 DIAGNOSIS — H2513 Age-related nuclear cataract, bilateral: Secondary | ICD-10-CM | POA: Diagnosis not present

## 2017-03-19 DIAGNOSIS — H34811 Central retinal vein occlusion, right eye, with macular edema: Secondary | ICD-10-CM | POA: Diagnosis not present

## 2017-03-28 DIAGNOSIS — H2513 Age-related nuclear cataract, bilateral: Secondary | ICD-10-CM | POA: Diagnosis not present

## 2017-03-28 DIAGNOSIS — H4089 Other specified glaucoma: Secondary | ICD-10-CM | POA: Diagnosis not present

## 2017-03-28 DIAGNOSIS — H34811 Central retinal vein occlusion, right eye, with macular edema: Secondary | ICD-10-CM | POA: Diagnosis not present

## 2017-03-28 DIAGNOSIS — H01002 Unspecified blepharitis right lower eyelid: Secondary | ICD-10-CM | POA: Diagnosis not present

## 2017-04-02 DIAGNOSIS — R739 Hyperglycemia, unspecified: Secondary | ICD-10-CM | POA: Diagnosis not present

## 2017-04-02 DIAGNOSIS — Z79899 Other long term (current) drug therapy: Secondary | ICD-10-CM | POA: Diagnosis not present

## 2017-04-02 DIAGNOSIS — H5461 Unqualified visual loss, right eye, normal vision left eye: Secondary | ICD-10-CM | POA: Diagnosis not present

## 2017-04-02 DIAGNOSIS — R9431 Abnormal electrocardiogram [ECG] [EKG]: Secondary | ICD-10-CM | POA: Diagnosis not present

## 2017-04-02 DIAGNOSIS — G459 Transient cerebral ischemic attack, unspecified: Secondary | ICD-10-CM | POA: Diagnosis not present

## 2017-04-02 DIAGNOSIS — Z794 Long term (current) use of insulin: Secondary | ICD-10-CM | POA: Diagnosis not present

## 2017-04-02 DIAGNOSIS — R202 Paresthesia of skin: Secondary | ICD-10-CM | POA: Diagnosis not present

## 2017-04-02 DIAGNOSIS — R200 Anesthesia of skin: Secondary | ICD-10-CM | POA: Diagnosis not present

## 2017-04-02 DIAGNOSIS — D869 Sarcoidosis, unspecified: Secondary | ICD-10-CM | POA: Diagnosis not present

## 2017-04-02 DIAGNOSIS — E1065 Type 1 diabetes mellitus with hyperglycemia: Secondary | ICD-10-CM | POA: Diagnosis not present

## 2017-04-03 DIAGNOSIS — I350 Nonrheumatic aortic (valve) stenosis: Secondary | ICD-10-CM | POA: Diagnosis not present

## 2017-04-03 DIAGNOSIS — Z794 Long term (current) use of insulin: Secondary | ICD-10-CM | POA: Diagnosis not present

## 2017-04-03 DIAGNOSIS — D869 Sarcoidosis, unspecified: Secondary | ICD-10-CM | POA: Diagnosis not present

## 2017-04-03 DIAGNOSIS — I348 Other nonrheumatic mitral valve disorders: Secondary | ICD-10-CM | POA: Diagnosis not present

## 2017-04-03 DIAGNOSIS — I639 Cerebral infarction, unspecified: Secondary | ICD-10-CM | POA: Diagnosis not present

## 2017-04-03 DIAGNOSIS — I368 Other nonrheumatic tricuspid valve disorders: Secondary | ICD-10-CM | POA: Diagnosis not present

## 2017-04-03 DIAGNOSIS — I6523 Occlusion and stenosis of bilateral carotid arteries: Secondary | ICD-10-CM | POA: Diagnosis not present

## 2017-04-03 DIAGNOSIS — E1165 Type 2 diabetes mellitus with hyperglycemia: Secondary | ICD-10-CM | POA: Diagnosis not present

## 2017-04-16 DIAGNOSIS — H44511 Absolute glaucoma, right eye: Secondary | ICD-10-CM | POA: Diagnosis not present

## 2017-04-16 DIAGNOSIS — H01001 Unspecified blepharitis right upper eyelid: Secondary | ICD-10-CM | POA: Diagnosis not present

## 2017-04-16 DIAGNOSIS — H01002 Unspecified blepharitis right lower eyelid: Secondary | ICD-10-CM | POA: Diagnosis not present

## 2017-04-16 DIAGNOSIS — H348112 Central retinal vein occlusion, right eye, stable: Secondary | ICD-10-CM | POA: Diagnosis not present

## 2017-04-25 DIAGNOSIS — H348112 Central retinal vein occlusion, right eye, stable: Secondary | ICD-10-CM | POA: Diagnosis not present

## 2017-04-25 DIAGNOSIS — H44511 Absolute glaucoma, right eye: Secondary | ICD-10-CM | POA: Diagnosis not present

## 2017-04-25 DIAGNOSIS — H01001 Unspecified blepharitis right upper eyelid: Secondary | ICD-10-CM | POA: Diagnosis not present

## 2017-04-25 DIAGNOSIS — H01002 Unspecified blepharitis right lower eyelid: Secondary | ICD-10-CM | POA: Diagnosis not present

## 2017-05-01 DIAGNOSIS — Z9889 Other specified postprocedural states: Secondary | ICD-10-CM | POA: Diagnosis not present

## 2017-05-01 DIAGNOSIS — R10814 Left lower quadrant abdominal tenderness: Secondary | ICD-10-CM | POA: Diagnosis not present

## 2017-05-01 DIAGNOSIS — K4090 Unilateral inguinal hernia, without obstruction or gangrene, not specified as recurrent: Secondary | ICD-10-CM | POA: Diagnosis not present

## 2017-05-01 DIAGNOSIS — Z8673 Personal history of transient ischemic attack (TIA), and cerebral infarction without residual deficits: Secondary | ICD-10-CM | POA: Diagnosis not present

## 2017-05-01 DIAGNOSIS — Z79899 Other long term (current) drug therapy: Secondary | ICD-10-CM | POA: Diagnosis not present

## 2017-05-01 DIAGNOSIS — R1904 Left lower quadrant abdominal swelling, mass and lump: Secondary | ICD-10-CM | POA: Diagnosis not present

## 2017-05-01 DIAGNOSIS — Z794 Long term (current) use of insulin: Secondary | ICD-10-CM | POA: Diagnosis not present

## 2017-05-01 DIAGNOSIS — D869 Sarcoidosis, unspecified: Secondary | ICD-10-CM | POA: Diagnosis not present

## 2017-05-01 DIAGNOSIS — E1165 Type 2 diabetes mellitus with hyperglycemia: Secondary | ICD-10-CM | POA: Diagnosis not present

## 2017-05-01 DIAGNOSIS — H5461 Unqualified visual loss, right eye, normal vision left eye: Secondary | ICD-10-CM | POA: Diagnosis not present

## 2017-05-14 DIAGNOSIS — K4090 Unilateral inguinal hernia, without obstruction or gangrene, not specified as recurrent: Secondary | ICD-10-CM | POA: Diagnosis not present

## 2017-05-15 DIAGNOSIS — Z01818 Encounter for other preprocedural examination: Secondary | ICD-10-CM | POA: Diagnosis not present

## 2017-05-15 DIAGNOSIS — E119 Type 2 diabetes mellitus without complications: Secondary | ICD-10-CM | POA: Diagnosis not present

## 2017-05-15 DIAGNOSIS — Z794 Long term (current) use of insulin: Secondary | ICD-10-CM | POA: Diagnosis not present

## 2017-05-22 DIAGNOSIS — R1030 Lower abdominal pain, unspecified: Secondary | ICD-10-CM | POA: Diagnosis not present

## 2017-05-22 DIAGNOSIS — Z5321 Procedure and treatment not carried out due to patient leaving prior to being seen by health care provider: Secondary | ICD-10-CM | POA: Diagnosis not present

## 2017-05-22 DIAGNOSIS — R319 Hematuria, unspecified: Secondary | ICD-10-CM | POA: Diagnosis not present

## 2018-09-12 ENCOUNTER — Other Ambulatory Visit: Payer: Self-pay

## 2018-09-12 DIAGNOSIS — Z20822 Contact with and (suspected) exposure to covid-19: Secondary | ICD-10-CM

## 2018-09-13 LAB — NOVEL CORONAVIRUS, NAA: SARS-CoV-2, NAA: NOT DETECTED
